# Patient Record
Sex: Female | Born: 1995 | Hispanic: Yes | Smoking: Never smoker
Health system: Southern US, Community
[De-identification: ages and names within clinical notes are randomized; demographics above are authoritative.]

## PROBLEM LIST (undated history)

## (undated) DIAGNOSIS — O039 Complete or unspecified spontaneous abortion without complication: Secondary | ICD-10-CM

## (undated) HISTORY — PX: FRACTURE SURGERY: SHX138

## (undated) HISTORY — PX: OTHER SURGICAL HISTORY: SHX169

---
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
## 2017-12-23 ENCOUNTER — Encounter (HOSPITAL_COMMUNITY): Payer: Self-pay

## 2017-12-23 ENCOUNTER — Other Ambulatory Visit: Payer: Self-pay

## 2017-12-23 ENCOUNTER — Ambulatory Visit (HOSPITAL_COMMUNITY)
Admission: EM | Admit: 2017-12-23 | Discharge: 2017-12-23 | Disposition: A | Discharge status: Home / Self Care | Payer: Self-pay | Attending: Family Medicine | Admitting: Family Medicine

## 2017-12-23 DIAGNOSIS — Z7722 Contact with and (suspected) exposure to environmental tobacco smoke (acute) (chronic): Secondary | ICD-10-CM | POA: Insufficient documentation

## 2017-12-23 DIAGNOSIS — B9789 Other viral agents as the cause of diseases classified elsewhere: Secondary | ICD-10-CM

## 2017-12-23 DIAGNOSIS — J029 Acute pharyngitis, unspecified: Secondary | ICD-10-CM | POA: Insufficient documentation

## 2017-12-23 DIAGNOSIS — R05 Cough: Secondary | ICD-10-CM

## 2017-12-23 DIAGNOSIS — J069 Acute upper respiratory infection, unspecified: Secondary | ICD-10-CM

## 2017-12-23 LAB — POCT RAPID STREP A: Streptococcus, Group A Screen (Direct): NEGATIVE

## 2017-12-23 MED ORDER — IBUPROFEN 600 MG PO TABS: 600.0000 mg | ORAL_TABLET | Freq: Four times a day (QID) | ORAL | 0 refills | Status: DC | PRN

## 2017-12-23 MED ORDER — PSEUDOEPH-BROMPHEN-DM 30-2-10 MG/5ML PO SYRP: 5.0000 mL | ORAL_SOLUTION | Freq: Four times a day (QID) | ORAL | 0 refills | Status: DC | PRN

## 2017-12-23 MED ORDER — CETIRIZINE HCL 10 MG PO CAPS: 10.0000 mg | ORAL_CAPSULE | Freq: Every day | ORAL | 0 refills | Status: DC

## 2017-12-23 MED ORDER — FLUTICASONE PROPIONATE 50 MCG/ACT NA SUSP: 1.0000 | Freq: Every day | NASAL | 0 refills | Status: DC

## 2017-12-23 NOTE — Discharge Instructions (Signed)
You likely having a viral upper respiratory infection. We recommended symptom control. I expect your symptoms to start improving in the next 1-2 weeks.   1. Take a daily allergy pill/anti-histamine like Zyrtec, Claritin, or Store brand consistently for 2 weeks  2. For congestion you may try an oral decongestant like Mucinex or sudafed. You may also try intranasal flonase nasal spray or saline irrigations (neti pot, sinus cleanse)  3. For your sore throat you may try cepacol lozenges, salt water gargles, throat spray. Treatment of congestion may also help your sore throat.  4. For cough you may try cough syrup provided or Robitussen, Mucinex Dm, Delsym  5. Take Tylenol or Ibuprofen to help with pain/inflammation  6. Stay hydrated, drink plenty of fluids to keep throat coated and less irritated  Honey Tea For cough/sore throat try using a honey-based tea. Use 3 teaspoons of honey with juice squeezed from half lemon. Place shaved pieces of ginger into 1/2-1 cup of water and warm over stove top. Then mix the ingredients and repeat every 4 hours as needed.

## 2017-12-23 NOTE — ED Provider Notes (Signed)
[facility]    CSN: [id number] Arrival date & time: 12/23/17  1331     History   Chief Complaint Chief Complaint  Patient presents with  . Facial Pain  . Nasal Congestion    HPI Amanda Vaughn is a 22 y.o. female no significant past medical history, Patient is presenting with URI symptoms- congestion, cough, sore throat.  Patient has also had body aches and headaches.  Patient's main complaints are sore throat and cough. Symptoms have been going on for 4 days. Patient has tried NyQuil, DayQuil, with minimal relief. Denies fever, nausea, vomiting, diarrhea. Denies shortness of breath and chest pain.    HPI  History reviewed. No pertinent past medical history.  There are no active problems to display for this patient.   Past Surgical History:  Procedure Laterality Date  . arm surgery    . FRACTURE SURGERY      OB History   None      Home Medications    Prior to Admission medications   Medication Sig Start Date End Date Taking? Authorizing Provider  brompheniramine-pseudoephedrine-DM 30-2-10 MG/5ML syrup Take 5 mLs by mouth 4 (four) times daily as needed. 12/23/17   Talene Glastetter C, PA-C  Cetirizine HCl 10 MG CAPS Take 1 capsule (10 mg total) by mouth daily for 10 days. 12/23/17 01/02/18  Neithan Day C, PA-C  fluticasone (FLONASE) 50 MCG/ACT nasal spray Place 1-2 sprays into both nostrils daily for 7 days. 12/23/17 12/30/17  Lilliona Blakeney C, PA-C  ibuprofen (ADVIL,MOTRIN) 600 MG tablet Take 1 tablet (600 mg total) by mouth every 6 (six) hours as needed. 12/23/17   Antanisha Mohs, Junius Creamer, PA-C    Family History Family History  Problem Relation Age of Onset  . Hyperlipidemia Other   . Hypertension Other   . Heart failure Other   . Diabetes Other   . Cancer Other     Social History Social History   Tobacco Use  . Smoking status: Passive Smoke Exposure - Never Smoker  . Smokeless tobacco: Never Used  Substance Use Topics  . Alcohol use: No  .  Drug use: No     Allergies   Patient has no known allergies.   Review of Systems Review of Systems  Constitutional: Negative for activity change, appetite change, chills, fatigue and fever.  HENT: Positive for congestion, rhinorrhea and sore throat. Negative for ear pain, sinus pressure and trouble swallowing.   Eyes: Negative for discharge and redness.  Respiratory: Positive for cough. Negative for chest tightness and shortness of breath.   Cardiovascular: Negative for chest pain.  Gastrointestinal: Negative for abdominal pain, diarrhea, nausea and vomiting.  Musculoskeletal: Positive for myalgias.  Skin: Negative for rash.  Neurological: Positive for headaches. Negative for dizziness and light-headedness.     Physical Exam Triage Vital Signs ED Triage Vitals  Enc Vitals Group     BP 12/23/17 1428 (!) 134/91     Pulse Rate 12/23/17 1428 89     Resp 12/23/17 1428 18     Temp 12/23/17 1427 99.1 F (37.3 C)     Temp Source 12/23/17 1427 Oral     SpO2 12/23/17 1428 100 %     Weight --      Height --      Head Circumference --      Peak Flow --      Pain Score 12/23/17 1428 3     Pain Loc --      Pain Edu? --  Excl. in GC? --    No data found.  Updated Vital Signs BP (!) 134/91 (BP Location: Right Arm)   Pulse 89   Temp 99.1 F (37.3 C) (Oral)   Resp 18   LMP 12/16/2017   SpO2 100%   Visual Acuity Right Eye Distance:   Left Eye Distance:   Bilateral Distance:    Right Eye Near:   Left Eye Near:    Bilateral Near:     Physical Exam  Constitutional: She appears well-developed and well-nourished. No distress.  HENT:  Head: Normocephalic and atraumatic.  Bilateral ears without tenderness to palpation of external auricle, tragus and mastoid, EAC's without erythema or swelling, TM's with good bony landmarks and cone of light. Non erythematous.  Oral mucosa pink and moist, no tonsillar enlargement or exudate. Posterior pharynx patent and erythematous, no  uvula deviation or swelling. Normal phonation.  Eyes: Conjunctivae are normal.  Neck: Neck supple.  Cardiovascular: Normal rate and regular rhythm.  No murmur heard. Pulmonary/Chest: Effort normal and breath sounds normal. No respiratory distress.  Breathing comfortably at rest, CTABL, no wheezing, rales or other adventitious sounds auscultated  Abdominal: Soft. There is no tenderness.  Musculoskeletal: She exhibits no edema.  Neurological: She is alert.  Skin: Skin is warm and dry.  Psychiatric: She has a normal mood and affect.  Nursing note and vitals reviewed.    UC Treatments / Results  Labs (all labs ordered are listed, but only abnormal results are displayed) Labs Reviewed  CULTURE, GROUP A STREP [facility])  POCT RAPID STREP A    EKG None  Radiology No results found.  Procedures Procedures (including critical care time)  Medications Ordered in UC Medications - No data to display  Initial Impression / Assessment and Plan / UC Course  I have reviewed the triage vital signs and the nursing notes.  Pertinent labs & imaging results that were available during my care of the patient were reviewed by me and considered in my medical decision making (see chart for details).      Strep test negative, URI symptoms x4 days, vital signs stable, exam unremarkable.  Recommend symptomatic management for likely viral etiology.  Recommendations below.  Continue to monitor symptoms, breathing and temperature,Discussed strict return precautions. Patient verbalized understanding and is agreeable with plan.  Final Clinical Impressions(s) / UC Diagnoses   Final diagnoses:  Viral URI with cough     Discharge Instructions     You likely having a viral upper respiratory infection. We recommended symptom control. I expect your symptoms to start improving in the next 1-2 weeks.   1. Take a daily allergy pill/anti-histamine like Zyrtec, Claritin, or Store brand consistently for 2  weeks  2. For congestion you may try an oral decongestant like Mucinex or sudafed. You may also try intranasal flonase nasal spray or saline irrigations (neti pot, sinus cleanse)  3. For your sore throat you may try cepacol lozenges, salt water gargles, throat spray. Treatment of congestion may also help your sore throat.  4. For cough you may try cough syrup provided or Robitussen, Mucinex Dm, Delsym  5. Take Tylenol or Ibuprofen to help with pain/inflammation  6. Stay hydrated, drink plenty of fluids to keep throat coated and less irritated  Honey Tea For cough/sore throat try using a honey-based tea. Use 3 teaspoons of honey with juice squeezed from half lemon. Place shaved pieces of ginger into 1/2-1 cup of water and warm over stove top. Then mix the ingredients  and repeat every 4 hours as needed.   ED Prescriptions    Medication Sig Dispense Auth. Provider   Cetirizine HCl 10 MG CAPS Take 1 capsule (10 mg total) by mouth daily for 10 days. 10 capsule Rheta Hemmelgarn C, PA-C   fluticasone (FLONASE) 50 MCG/ACT nasal spray Place 1-2 sprays into both nostrils daily for 7 days. 1 g Jayda White C, PA-C   brompheniramine-pseudoephedrine-DM 30-2-10 MG/5ML syrup Take 5 mLs by mouth 4 (four) times daily as needed. 120 mL Tarence Searcy C, PA-C   ibuprofen (ADVIL,MOTRIN) 600 MG tablet Take 1 tablet (600 mg total) by mouth every 6 (six) hours as needed. 30 tablet Shavon Zenz, CenterHallie C, PA-C     Controlled Substance Prescriptions [place] Controlled Substance Registry consulted? Not Applicable   Lew DawesWieters, Shanise Balch C, [place]PA-C 12/23/17 1916

## 2017-12-23 NOTE — ED Triage Notes (Signed)
Pt cc  Cough and congestion. x 3 days dry cough as well.

## 2017-12-26 LAB — CULTURE, GROUP A STREP (THRC)

## 2018-10-07 ENCOUNTER — Encounter (HOSPITAL_COMMUNITY): Payer: Self-pay | Admitting: Emergency Medicine

## 2018-10-07 ENCOUNTER — Other Ambulatory Visit: Payer: Self-pay

## 2018-10-07 ENCOUNTER — Emergency Department (HOSPITAL_COMMUNITY)
Admission: EM | Admit: 2018-10-07 | Discharge: 2018-10-08 | Disposition: A | Discharge status: Home / Self Care | Payer: Self-pay | Attending: Emergency Medicine | Admitting: Emergency Medicine

## 2018-10-07 DIAGNOSIS — Z3202 Encounter for pregnancy test, result negative: Secondary | ICD-10-CM | POA: Insufficient documentation

## 2018-10-07 DIAGNOSIS — Z7722 Contact with and (suspected) exposure to environmental tobacco smoke (acute) (chronic): Secondary | ICD-10-CM | POA: Insufficient documentation

## 2018-10-07 DIAGNOSIS — N938 Other specified abnormal uterine and vaginal bleeding: Secondary | ICD-10-CM | POA: Insufficient documentation

## 2018-10-07 DIAGNOSIS — R110 Nausea: Secondary | ICD-10-CM | POA: Insufficient documentation

## 2018-10-07 LAB — COMPREHENSIVE METABOLIC PANEL
ALT: 20 U/L (ref 0–44)
AST: 19 U/L (ref 15–41)
Albumin: 3.9 g/dL (ref 3.5–5.0)
Alkaline Phosphatase: 64 U/L (ref 38–126)
Anion gap: 8 (ref 5–15)
BUN: 8 mg/dL (ref 6–20)
CO2: 22 mmol/L (ref 22–32)
Calcium: 9.2 mg/dL (ref 8.9–10.3)
Chloride: 108 mmol/L (ref 98–111)
Creatinine, Ser: 0.7 mg/dL (ref 0.44–1.00)
GFR calc Af Amer: >60 mL/min (ref >60)
GFR calc non Af Amer: >60 mL/min (ref >60)
Glucose, Bld: 92 mg/dL (ref 70–99)
Potassium: 4.1 mmol/L (ref 3.5–5.1)
Sodium: 138 mmol/L (ref 135–145)
Total Bilirubin: 0.1 mg/dL — ABNORMAL LOW (ref 0.3–1.2)
Total Protein: 7.1 g/dL (ref 6.5–8.1)

## 2018-10-07 LAB — URINALYSIS, ROUTINE W REFLEX MICROSCOPIC
Bilirubin Urine: NEGATIVE
Glucose, UA: NEGATIVE mg/dL
Ketones, ur: NEGATIVE mg/dL
Leukocytes,Ua: NEGATIVE
Nitrite: NEGATIVE
Protein, ur: NEGATIVE mg/dL
Specific Gravity, Urine: 1.006 (ref 1.005–1.030)
pH: 7 (ref 5.0–8.0)

## 2018-10-07 LAB — CBC
HCT: 40.8 % (ref 36.0–46.0)
Hemoglobin: 13.3 g/dL (ref 12.0–15.0)
MCH: 26.1 pg (ref 26.0–34.0)
MCHC: 32.6 g/dL (ref 30.0–36.0)
MCV: 80.2 fL (ref 80.0–100.0)
Platelets: 462 10*3/uL — ABNORMAL HIGH (ref 150–400)
RBC: 5.09 MIL/uL (ref 3.87–5.11)
RDW: 15.5 % (ref 11.5–15.5)
WBC: 10 10*3/uL (ref 4.0–10.5)
nRBC: 0 % (ref 0.0–0.2)

## 2018-10-07 LAB — I-STAT BETA HCG BLOOD, ED (MC, WL, AP ONLY): I-stat hCG, quantitative: <5 m[IU]/mL (ref <5)

## 2018-10-07 MED ORDER — SODIUM CHLORIDE 0.9% FLUSH: 3.0000 mL | Freq: Once | INTRAVENOUS | Status: DC

## 2018-10-07 NOTE — ED Triage Notes (Signed)
Pt concerned she may be pregnant. C/o lower abdominal pain and nausea. Reports she has been having off and on spotting x 2 days that has been a "mocha" color. LMP 7/31.

## 2018-10-08 LAB — WET PREP, GENITAL
Sperm: NONE SEEN
Trich, Wet Prep: NONE SEEN
Yeast Wet Prep HPF POC: NONE SEEN

## 2018-10-08 MED ORDER — NAPROXEN 500 MG PO TABS: 500.0000 mg | ORAL_TABLET | Freq: Two times a day (BID) | ORAL | 0 refills | Status: DC | PRN

## 2018-10-08 MED ORDER — NAPROXEN 250 MG PO TABS
500.0000 mg | ORAL_TABLET | Freq: Once | ORAL | Status: AC
  Administered 2018-10-08: 07:00:00 500 mg via ORAL
  Filled 2018-10-08: qty 2

## 2018-10-08 NOTE — ED Notes (Signed)
Discharge instructions discussed with pt. Pt verbalized understanding. Pt stable and ambulatory. No signature pad available. 

## 2018-10-08 NOTE — Discharge Instructions (Addendum)
Your work-up in the emergency department today has been reassuring.  We recommend the use of naproxen for management of persistent pain.  Follow-up with an OB/GYN for repeat evaluation and ongoing GYN care.  You may return for any new or concerning symptoms.

## 2018-10-08 NOTE — ED Provider Notes (Signed)
[name] Provider Note   CSN: [id number] Arrival date & time: 10/07/18  2004     History   Chief Complaint Chief Complaint  Patient presents with  . Possible Pregnancy  . Abdominal Pain    HPI Amanda Vaughn is a 23 y.o. female.     23 y/o female presents to the emergency department for 11 days of lower abdominal discomfort.  She states that pain is been constant and unchanged.  The pain does not radiate.  It is worse with certain movements as well as when bending over.  Reports some associated nausea without vomiting.  No bowel changes.  It became associated with vaginal spotting 48 hours ago.  She has required the use of 2 pads in 48 hours.  Patient denies vaginal discharge, fever, dysuria, hematuria.  She is sexually active with one female partner and denies the use of condoms.  Last menstrual period 08/27/2018.  Expresses concern for pregnancy.  The history is provided by the patient. No language interpreter was used.  Possible Pregnancy Associated symptoms include abdominal pain.  Abdominal Pain   History reviewed. No pertinent past medical history.  There are no active problems to display for this patient.   Past Surgical History:  Procedure Laterality Date  . arm surgery    . FRACTURE SURGERY       OB History   No obstetric history on file.      Home Medications    Prior to Admission medications   Medication Sig Start Date End Date Taking? Authorizing Provider  brompheniramine-pseudoephedrine-DM 30-2-10 MG/5ML syrup Take 5 mLs by mouth 4 (four) times daily as needed. 12/23/17   Wieters, Hallie C, PA-C  Cetirizine HCl 10 MG CAPS Take 1 capsule (10 mg total) by mouth daily for 10 days. 12/23/17 01/02/18  Wieters, Hallie C, PA-C  fluticasone (FLONASE) 50 MCG/ACT nasal spray Place 1-2 sprays into both nostrils daily for 7 days. 12/23/17 12/30/17  Wieters, Hallie C, PA-C  ibuprofen (ADVIL,MOTRIN) 600 MG tablet Take 1 tablet  (600 mg total) by mouth every 6 (six) hours as needed. 12/23/17   Wieters, Hallie C, PA-C  naproxen (NAPROSYN) 500 MG tablet Take 1 tablet (500 mg total) by mouth every 12 (twelve) hours as needed for mild pain or moderate pain. 10/08/18   Antonietta Breach, PA-C    Family History Family History  Problem Relation Age of Onset  . Hyperlipidemia Other   . Hypertension Other   . Heart failure Other   . Diabetes Other   . Cancer Other     Social History Social History   Tobacco Use  . Smoking status: Passive Smoke Exposure - Never Smoker  . Smokeless tobacco: Never Used  Substance Use Topics  . Alcohol use: No  . Drug use: No     Allergies   Patient has no known allergies.   Review of Systems Review of Systems  Gastrointestinal: Positive for abdominal pain.  Ten systems reviewed and are negative for acute change, except as noted in the HPI.    Physical Exam Updated Vital Signs BP 124/77   Pulse 72   Temp 98.2 F (36.8 C) (Oral)   Resp 16   LMP 08/27/2018   SpO2 100%   Physical Exam Vitals signs and nursing note reviewed.  Constitutional:      General: She is not in acute distress.    Appearance: She is well-developed. She is not diaphoretic.     Comments: Nontoxic appearing  and in NAD  HENT:     Head: Normocephalic and atraumatic.  Eyes:     General: No scleral icterus.    Conjunctiva/sclera: Conjunctivae normal.  Neck:     Musculoskeletal: Normal range of motion.  Pulmonary:     Effort: Pulmonary effort is normal. No respiratory distress.     Comments: Respirations even and unlabored Abdominal:     Palpations: There is no mass.     Tenderness: There is no guarding.     Comments: Reports pain when palpated in the lower abdomen, but minimal tenderness.  Exam is benign.  Abdomen is soft, nondistended.  No peritoneal signs.  Genitourinary:    Comments: Normal external genitalia. Pale pink discharge. Speculum exam deferred as patient has never had a pelvic exam  performed. Musculoskeletal: Normal range of motion.  Skin:    General: Skin is warm and dry.     Coloration: Skin is not pale.     Findings: No erythema or rash.  Neurological:     General: No focal deficit present.     Mental Status: She is alert and oriented to person, place, and time.     Coordination: Coordination normal.     Comments: Ambulates with steady gait.  Psychiatric:        Behavior: Behavior normal.      ED Treatments / Results  Labs (all labs ordered are listed, but only abnormal results are displayed) Labs Reviewed  COMPREHENSIVE METABOLIC PANEL - Abnormal; Notable for the following components:      Result Value   Total Bilirubin 0.1 (*)    All other components within normal limits  CBC - Abnormal; Notable for the following components:   Platelets 462 (*)    All other components within normal limits  URINALYSIS, ROUTINE W REFLEX MICROSCOPIC - Abnormal; Notable for the following components:   Color, Urine STRAW (*)    Hgb urine dipstick MODERATE (*)    Bacteria, UA RARE (*)    All other components within normal limits  WET PREP, GENITAL  I-STAT BETA HCG BLOOD, ED ([facility], [facility], [facility] ONLY)  GC/CHLAMYDIA PROBE AMP ([place]) NOT AT [facility]    EKG None  Radiology No results found.  Procedures Procedures (including critical care time)  Medications Ordered in ED Medications  sodium chloride flush (NS) 0.9 % injection 3 mL (3 mLs Intravenous Not Given 10/08/18 0634)  naproxen (NAPROSYN) tablet 500 mg (500 mg Oral Given 10/08/18 1610)     Initial Impression / Assessment and Plan / ED Course  I have reviewed the triage vital signs and the nursing notes.  Pertinent labs & imaging results that were available during my care of the patient were reviewed by me and considered in my medical decision making (see chart for details).        23 year old female presents to the emergency department for lower abdominal cramping.  This is suspected secondary to  dysfunctional uterine bleeding.  Pain present x11 days.  She has no focal tenderness on exam.  Doubt ovarian torsion given symptom chronicity.  Also low suspicion for ovarian abscess.  The patient is nontoxic, afebrile.  She has no leukocytosis.  Laboratory evaluation reassuring.    Wet prep is not presently concerning for STIs. GC/Chlamydia pending. Plan for supportive management with naproxen.  She has been given referral to women's outpatient clinic for follow-up PRN.  Return precautions discussed and provided. Patient discharged in stable condition with no unaddressed concerns.   Final Clinical Impressions(s) /  ED Diagnoses   Final diagnoses:  Dysfunctional uterine bleeding    ED Discharge Orders         Ordered    naproxen (NAPROSYN) 500 MG tablet  Every 12 hours PRN     10/08/18 0631           Antony MaduraHumes, Ladina Shutters, PA-C 10/08/18 [id number]    Horton, Mayer Maskerourtney F, MD 10/11/18 [id number]    Shon BatonHorton, Courtney F, MD 10/11/18 [phone]

## 2018-10-09 LAB — GC/CHLAMYDIA PROBE AMP (~~LOC~~) NOT AT ARMC
Chlamydia: POSITIVE — AB
Neisseria Gonorrhea: NEGATIVE

## 2019-06-01 ENCOUNTER — Encounter (HOSPITAL_COMMUNITY): Payer: Self-pay | Admitting: Emergency Medicine

## 2019-06-01 ENCOUNTER — Emergency Department (HOSPITAL_COMMUNITY)

## 2019-06-01 ENCOUNTER — Emergency Department (HOSPITAL_COMMUNITY)
Admission: EM | Admit: 2019-06-01 | Discharge: 2019-06-01 | Disposition: A | Discharge status: Home / Self Care | Attending: Emergency Medicine | Admitting: Emergency Medicine

## 2019-06-01 ENCOUNTER — Other Ambulatory Visit: Payer: Self-pay

## 2019-06-01 DIAGNOSIS — Z7722 Contact with and (suspected) exposure to environmental tobacco smoke (acute) (chronic): Secondary | ICD-10-CM | POA: Insufficient documentation

## 2019-06-01 DIAGNOSIS — M542 Cervicalgia: Secondary | ICD-10-CM | POA: Diagnosis not present

## 2019-06-01 DIAGNOSIS — Y92511 Restaurant or cafe as the place of occurrence of the external cause: Secondary | ICD-10-CM | POA: Insufficient documentation

## 2019-06-01 DIAGNOSIS — M25511 Pain in right shoulder: Secondary | ICD-10-CM | POA: Insufficient documentation

## 2019-06-01 DIAGNOSIS — R0602 Shortness of breath: Secondary | ICD-10-CM | POA: Insufficient documentation

## 2019-06-01 DIAGNOSIS — R0789 Other chest pain: Secondary | ICD-10-CM | POA: Diagnosis not present

## 2019-06-01 DIAGNOSIS — R1310 Dysphagia, unspecified: Secondary | ICD-10-CM | POA: Insufficient documentation

## 2019-06-01 DIAGNOSIS — Y9389 Activity, other specified: Secondary | ICD-10-CM | POA: Diagnosis not present

## 2019-06-01 DIAGNOSIS — R519 Headache, unspecified: Secondary | ICD-10-CM | POA: Diagnosis present

## 2019-06-01 DIAGNOSIS — Y998 Other external cause status: Secondary | ICD-10-CM | POA: Insufficient documentation

## 2019-06-01 HISTORY — DX: Complete or unspecified spontaneous abortion without complication: O03.9

## 2019-06-01 LAB — I-STAT BETA HCG BLOOD, ED (MC, WL, AP ONLY): I-stat hCG, quantitative: <5 m[IU]/mL (ref <5)

## 2019-06-01 LAB — I-STAT CREATININE, ED: Creatinine, Ser: 0.5 mg/dL (ref 0.44–1.00)

## 2019-06-01 LAB — POC URINE PREG, ED: Preg Test, Ur: NEGATIVE

## 2019-06-01 MED ORDER — CYCLOBENZAPRINE HCL 10 MG PO TABS
10.0000 mg | ORAL_TABLET | Freq: Two times a day (BID) | ORAL | 0 refills | Status: DC | PRN
Start: 2019-06-01 — End: 2020-12-29

## 2019-06-01 MED ORDER — LIDOCAINE 4 % EX CREA
1.0000 | TOPICAL_CREAM | Freq: Three times a day (TID) | CUTANEOUS | 0 refills | Status: DC | PRN
Start: 2019-06-01 — End: 2020-12-29

## 2019-06-01 MED ORDER — KETOROLAC TROMETHAMINE 30 MG/ML IJ SOLN
30.0000 mg | Freq: Once | INTRAMUSCULAR | Status: AC
  Administered 2019-06-01: 30 mg via INTRAVENOUS
  Filled 2019-06-01: qty 1

## 2019-06-01 MED ORDER — KETOROLAC TROMETHAMINE 60 MG/2ML IM SOLN: 60.0000 mg | Freq: Once | INTRAMUSCULAR | Status: DC

## 2019-06-01 MED ORDER — IBUPROFEN 600 MG PO TABS
600.0000 mg | ORAL_TABLET | Freq: Four times a day (QID) | ORAL | 0 refills | Status: DC | PRN
Start: 2019-06-01 — End: 2020-12-29

## 2019-06-01 MED ORDER — ACETAMINOPHEN 500 MG PO TABS
500.0000 mg | ORAL_TABLET | Freq: Four times a day (QID) | ORAL | 0 refills | Status: DC | PRN
Start: 2019-06-01 — End: 2020-12-29

## 2019-06-01 MED ORDER — IOHEXOL 300 MG/ML  SOLN
75.0000 mL | Freq: Once | INTRAMUSCULAR | Status: AC | PRN
  Administered 2019-06-01: 75 mL via INTRAVENOUS

## 2019-06-01 NOTE — ED Notes (Signed)
Patient verbalizes understanding of discharge instructions. Opportunity for questioning and answers were provided. Armband removed by staff, pt discharged from ED ambulatory to home.  
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
## 2019-06-01 NOTE — Discharge Instructions (Signed)
Alternate 600 mg of ibuprofen and [phone] mg of Tylenol every 3 hours as needed for pain. Do not exceed 4000 mg of Tylenol daily.  Take ibuprofen with food to avoid upset stomach issues.  Apply ice or heat, whichever feels best for comfort.  I usually recommend 20 minutes at a time 2-3 times daily.  Take some hot showers and hot baths throughout the day and do some gentle stretching to avoid muscle stiffness.  You can also apply lidocaine cream to areas of soreness.  You can take Flexeril which is a muscle relaxant.  This medication can cause drowsiness.  Do not drive, drink alcohol or operate heavy machinery while taking this medication due to side effects.  I typically recommend only taking at night to help you get to sleep and you can also cut these tablets in half if they are very sedating.  You may have a concussion, which is head injury without evidence of abnormality on CT scan.  Stay in a dimly lit room with minimal stimuli, reduce screen time (phone, TV) by at least 50% if not more, and avoid contact sports until cleared by a physician.    I anticipate you will probably feel sore for approximately 1 week.  Do not feel surprised if you wake up tomorrow feeling more sore than you do today. Follow-up with a primary care physician if you are not back to your normal activity levels within 1 week.  Return to the emergency department immediately for any concerning signs or symptoms develop such as altered mental status, persistent vomiting, weakness, severe swelling or redness of a limb, or fevers.

## 2020-12-29 ENCOUNTER — Other Ambulatory Visit: Payer: Self-pay

## 2020-12-29 ENCOUNTER — Ambulatory Visit (HOSPITAL_COMMUNITY)
Admission: EM | Admit: 2020-12-29 | Discharge: 2020-12-29 | Disposition: A | Discharge status: Home / Self Care | Payer: Medicaid Other | Attending: Physician Assistant | Admitting: Physician Assistant

## 2020-12-29 ENCOUNTER — Encounter (HOSPITAL_COMMUNITY): Payer: Self-pay

## 2020-12-29 ENCOUNTER — Ambulatory Visit (HOSPITAL_COMMUNITY): Admit: 2020-12-29 | Discharge status: Absent without leave | Payer: Self-pay

## 2020-12-29 DIAGNOSIS — R110 Nausea: Secondary | ICD-10-CM | POA: Insufficient documentation

## 2020-12-29 DIAGNOSIS — R1084 Generalized abdominal pain: Secondary | ICD-10-CM | POA: Insufficient documentation

## 2020-12-29 LAB — POC URINE PREG, ED: Preg Test, Ur: NEGATIVE

## 2020-12-29 LAB — COMPREHENSIVE METABOLIC PANEL
ALT: 42 U/L (ref 0–44)
AST: 27 U/L (ref 15–41)
Albumin: 3.6 g/dL (ref 3.5–5.0)
Alkaline Phosphatase: 72 U/L (ref 38–126)
Anion gap: 4 — ABNORMAL LOW (ref 5–15)
BUN: 8 mg/dL (ref 6–20)
CO2: 24 mmol/L (ref 22–32)
Calcium: 8.7 mg/dL — ABNORMAL LOW (ref 8.9–10.3)
Chloride: 111 mmol/L (ref 98–111)
Creatinine, Ser: 0.72 mg/dL (ref 0.44–1.00)
GFR, Estimated: >60 mL/min (ref >60)
Glucose, Bld: 95 mg/dL (ref 70–99)
Potassium: 4.1 mmol/L (ref 3.5–5.1)
Sodium: 139 mmol/L (ref 135–145)
Total Bilirubin: 0.2 mg/dL — ABNORMAL LOW (ref 0.3–1.2)
Total Protein: 6.8 g/dL (ref 6.5–8.1)

## 2020-12-29 LAB — CBC WITH DIFFERENTIAL/PLATELET
Abs Immature Granulocytes: 0.03 10*3/uL (ref 0.00–0.07)
Basophils Absolute: 0 10*3/uL (ref 0.0–0.1)
Basophils Relative: 0 %
Eosinophils Absolute: 0 10*3/uL (ref 0.0–0.5)
Eosinophils Relative: 0 %
HCT: 43.2 % (ref 36.0–46.0)
Hemoglobin: 14.1 g/dL (ref 12.0–15.0)
Immature Granulocytes: 0 %
Lymphocytes Relative: 37 %
Lymphs Abs: 2.6 10*3/uL (ref 0.7–4.0)
MCH: 26.9 pg (ref 26.0–34.0)
MCHC: 32.6 g/dL (ref 30.0–36.0)
MCV: 82.4 fL (ref 80.0–100.0)
Monocytes Absolute: 0.4 10*3/uL (ref 0.1–1.0)
Monocytes Relative: 6 %
Neutro Abs: 3.9 10*3/uL (ref 1.7–7.7)
Neutrophils Relative %: 57 %
Platelets: 540 10*3/uL — ABNORMAL HIGH (ref 150–400)
RBC: 5.24 MIL/uL — ABNORMAL HIGH (ref 3.87–5.11)
RDW: 14.6 % (ref 11.5–15.5)
WBC: 7 10*3/uL (ref 4.0–10.5)
nRBC: 0 % (ref 0.0–0.2)

## 2020-12-29 LAB — POCT URINALYSIS DIPSTICK, ED / UC
Bilirubin Urine: NEGATIVE
Glucose, UA: NEGATIVE mg/dL
Hgb urine dipstick: NEGATIVE
Leukocytes,Ua: NEGATIVE
Nitrite: NEGATIVE
Protein, ur: NEGATIVE mg/dL
Specific Gravity, Urine: >=1.03 (ref 1.005–1.030)
Urobilinogen, UA: 0.2 mg/dL (ref 0.0–1.0)
pH: 6 (ref 5.0–8.0)

## 2020-12-29 MED ORDER — PANTOPRAZOLE SODIUM 40 MG PO TBEC: 40.0000 mg | DELAYED_RELEASE_TABLET | Freq: Every day | ORAL | 0 refills | Status: AC

## 2020-12-29 MED ORDER — ONDANSETRON 4 MG PO TBDP: 4.0000 mg | ORAL_TABLET | Freq: Three times a day (TID) | ORAL | 0 refills | Status: AC | PRN

## 2020-12-29 NOTE — ED Provider Notes (Signed)
[facility]    CSN: [id number] Arrival date & time: 12/29/20  1251      History   Chief Complaint Chief Complaint  Patient presents with   Abdominal Pain    HPI Amanda Vaughn is a 25 y.o. female.   Patient presents today with a several week history of migrating abdominal pain.  She reports several weeks ago she woke up in a cold sweat which lasted for 24 hours.  Since that time she has had ongoing migrating abdominal pain.  Reports this is primarily in her upper abdomen but will occasionally travel to her lower abdomen.  She is currently asymptomatic symptoms and to be triggered by eating.  She has not identified any specific foods that exacerbate symptoms.  She reports occasional lightheadedness and feeling poorly.  She does not take NSAIDs on a regular basis or drink alcohol regularly.  She denies any recent medication changes.  Denies any suspicious food intake, recent travel.  Denies any blood in her stool, vomiting, syncope, chest pain, shortness of breath.  She denies history of gastrointestinal disorder including ulcer disease.  She has not had abdominal surgeries and still has her gallbladder and appendix.  She has not tried any medication for symptom management.   Past Medical History:  Diagnosis Date   Miscarriage     There are no problems to display for this patient.   Past Surgical History:  Procedure Laterality Date   arm surgery     FRACTURE SURGERY      OB History   No obstetric history on file.      Home Medications    Prior to Admission medications   Medication Sig Start Date End Date Taking? Authorizing Provider  ondansetron (ZOFRAN-ODT) 4 MG disintegrating tablet Take 1 tablet (4 mg total) by mouth every 8 (eight) hours as needed for nausea or vomiting. 12/29/20  Yes Daymond Cordts K, PA-C  pantoprazole (PROTONIX) 40 MG tablet Take 1 tablet (40 mg total) by mouth daily. 12/29/20  Yes Fae Blossom, Noberto Retort, PA-C    Family History Family History   Problem Relation Age of Onset   Hyperlipidemia Other    Hypertension Other    Heart failure Other    Diabetes Other    Cancer Other     Social History Social History   Tobacco Use   Smoking status: Passive Smoke Exposure - Never Smoker   Smokeless tobacco: Never  Substance Use Topics   Alcohol use: No   Drug use: No     Allergies   Patient has no known allergies.   Review of Systems Review of Systems  Constitutional:  Positive for activity change, appetite change and fatigue. Negative for fever.  Respiratory:  Negative for cough and shortness of breath.   Cardiovascular:  Negative for chest pain.  Gastrointestinal:  Positive for abdominal pain and nausea. Negative for blood in stool, constipation, diarrhea and vomiting.  Musculoskeletal:  Negative for arthralgias and myalgias.  Neurological:  Negative for dizziness, light-headedness and headaches.    Physical Exam Triage Vital Signs ED Triage Vitals  Enc Vitals Group     BP 12/29/20 1412 (!) 137/92     Pulse Rate 12/29/20 1412 86     Resp 12/29/20 1412 18     Temp 12/29/20 1412 98.2 F (36.8 C)     Temp Source 12/29/20 1412 Oral     SpO2 12/29/20 1412 97 %     Weight --      Height --  Head Circumference --      Peak Flow --      Pain Score 12/29/20 1410 6     Pain Loc --      Pain Edu? --      Excl. in GC? --    No data found.  Updated Vital Signs BP (!) 137/92 (BP Location: Right Arm)   Pulse 86   Temp 98.2 F (36.8 C) (Oral)   Resp 18   SpO2 97%   Visual Acuity Right Eye Distance:   Left Eye Distance:   Bilateral Distance:    Right Eye Near:   Left Eye Near:    Bilateral Near:     Physical Exam Vitals reviewed.  Constitutional:      General: She is awake. She is not in acute distress.    Appearance: Normal appearance. She is well-developed. She is not ill-appearing.     Comments: Very pleasant female appears stated age sitting comfortably on exam room table in no acute distress   HENT:     Head: Normocephalic and atraumatic.     Mouth/Throat:     Pharynx: Uvula midline. No oropharyngeal exudate or posterior oropharyngeal erythema.  Cardiovascular:     Rate and Rhythm: Normal rate and regular rhythm.     Heart sounds: Normal heart sounds, S1 normal and S2 normal. No murmur heard. Pulmonary:     Effort: Pulmonary effort is normal.     Breath sounds: Normal breath sounds. No wheezing, rhonchi or rales.     Comments: Clear to auscultation bilaterally Abdominal:     General: Bowel sounds are normal.     Palpations: Abdomen is soft.     Tenderness: There is no abdominal tenderness. There is no right CVA tenderness, left CVA tenderness, guarding or rebound.     Comments: Benign abdominal exam.  No tenderness palpation.  Psychiatric:        Behavior: Behavior is cooperative.     UC Treatments / Results  Labs (all labs ordered are listed, but only abnormal results are displayed) Labs Reviewed  POCT URINALYSIS DIPSTICK, ED / UC - Abnormal; Notable for the following components:      Result Value   Ketones, ur TRACE (*)    All other components within normal limits  COMPREHENSIVE METABOLIC PANEL  CBC WITH DIFFERENTIAL/PLATELET  POC URINE PREG, ED    EKG   Radiology No results found.  Procedures Procedures (including critical care time)  Medications Ordered in UC Medications - No data to display  Initial Impression / Assessment and Plan / UC Course  I have reviewed the triage vital signs and the nursing notes.  Pertinent labs & imaging results that were available during my care of the patient were reviewed by me and considered in my medical decision making (see chart for details).     Vital signs and physical exam reassuring today; no indication for emergent evaluation or imaging.  Urine pregnancy was negative.  UA showed trace ketones otherwise was normal.  Patient reports that she is able to eat and drink but this does exacerbate symptoms.  She was  prescribed Zofran for nausea symptoms.  Recommend she start Protonix on an empty stomach.  Discussed that ultimately she needs to be evaluated by a GI specialist for further evaluation to rule out cholelithiasis and or peptic ulcer disease since we do not have the capabilities in urgent care.  She was given contact information for local GI specialist and encouraged to call to schedule  appointment with them first thing next week.  CBC and CMP were obtained today and if she has significant anemia or abnormal liver function testing she will need to go to the emergency room.  Recommend she avoid alcohol and NSAIDs.  Recommend she eat a bland diet and drink plenty of fluid.  Discussed alarm symptoms that warrant emergent evaluation including focal pain, nausea/vomiting interfering with oral intake, dark or bloody stools, syncopal episode, chest pain, shortness of breath.  Strict return precautions given to which she expressed understanding.  Final Clinical Impressions(s) / UC Diagnoses   Final diagnoses:  Generalized abdominal pain  Nausea     Discharge Instructions      I have called in Zofran to help with your nausea symptoms.  Please start Protonix in the morning 30 minutes before you eat or 2 hours after a meal to help with stomach pain and symptoms.  Make sure you eat a bland diet and avoid spicy/acidic/fatty foods.  Make sure you are drinking plenty of fluid.  I recommend you follow-up with the stomach specialist please call to schedule appointment first thing next week.  If you have any worsening symptoms including severe focal pain, nausea/vomiting interfering with oral intake, blood in her stool need to be seen immediately.     ED Prescriptions     Medication Sig Dispense Auth. Provider   ondansetron (ZOFRAN-ODT) 4 MG disintegrating tablet Take 1 tablet (4 mg total) by mouth every 8 (eight) hours as needed for nausea or vomiting. 20 tablet Nikolos Billig K, PA-C   pantoprazole (PROTONIX) 40  MG tablet Take 1 tablet (40 mg total) by mouth daily. 14 tablet Ndeye Tenorio, Noberto Retort, PA-C      PDMP not reviewed this encounter.   Jeani Hawking, PA-C 12/29/20 1522

## 2020-12-29 NOTE — Discharge Instructions (Signed)
I have called in Zofran to help with your nausea symptoms.  Please start Protonix in the morning 30 minutes before you eat or 2 hours after a meal to help with stomach pain and symptoms.  Make sure you eat a bland diet and avoid spicy/acidic/fatty foods.  Make sure you are drinking plenty of fluid.  I recommend you follow-up with the stomach specialist please call to schedule appointment first thing next week.  If you have any worsening symptoms including severe focal pain, nausea/vomiting interfering with oral intake, blood in her stool need to be seen immediately.

## 2020-12-29 NOTE — ED Triage Notes (Signed)
Pt reports nauseas after eating, on and off lightheadedness and lower abdominal pain x 12 days.
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
## 2021-08-12 ENCOUNTER — Encounter (HOSPITAL_COMMUNITY): Payer: Self-pay

## 2021-08-12 ENCOUNTER — Ambulatory Visit (HOSPITAL_COMMUNITY)
Admission: EM | Admit: 2021-08-12 | Discharge: 2021-08-12 | Disposition: A | Discharge status: Home / Self Care | Payer: Medicaid Other | Attending: Internal Medicine | Admitting: Internal Medicine

## 2021-08-12 DIAGNOSIS — N926 Irregular menstruation, unspecified: Secondary | ICD-10-CM

## 2021-08-12 LAB — POCT URINALYSIS DIPSTICK, ED / UC
Bilirubin Urine: NEGATIVE
Glucose, UA: NEGATIVE mg/dL
Hgb urine dipstick: NEGATIVE
Ketones, ur: NEGATIVE mg/dL
Leukocytes,Ua: NEGATIVE
Nitrite: NEGATIVE
Protein, ur: NEGATIVE mg/dL
Specific Gravity, Urine: >=1.03 (ref 1.005–1.030)
Urobilinogen, UA: 0.2 mg/dL (ref 0.0–1.0)
pH: 5.5 (ref 5.0–8.0)

## 2021-08-12 LAB — POC URINE PREG, ED: Preg Test, Ur: NEGATIVE

## 2021-08-12 NOTE — Discharge Instructions (Addendum)
[facility] for evaluation of your abnormal menstrual periods.

## 2021-08-12 NOTE — ED Provider Notes (Signed)
[facility]    CSN: [id number] Arrival date & time: 08/12/21  1535      History   Chief Complaint Chief Complaint  Patient presents with   Abdominal Pain   Menstrual Problem    HPI Amanda Vaughn is a 26 y.o. female.   HPI Patient with a history of recurrent abdominal pain presents today with with a concern of abdominal pain when she is exercising and sitting in a particular position.  The pain is not present at any other time.  This has been ongoing for few months.  Previously she was seen for recurrent heartburn and prescribed Protonix which she reports she stopped taking after the symptoms stopped.  She is not having those types of symptoms.  Endorses some dizziness and nausea when exercising.   She is also concerned about irregular periods and increased appetite.  She endorses having unprotected sex and no birth control along with an increased urge to urinate and flank pain.  She has had several irregular episodes of blood.  With her last menstrual period ending on 07/30/21.   Past Medical History:  Diagnosis Date   Miscarriage     There are no problems to display for this patient.   Past Surgical History:  Procedure Laterality Date   arm surgery     FRACTURE SURGERY      OB History   No obstetric history on file.      Home Medications    Prior to Admission medications   Medication Sig Start Date End Date Taking? Authorizing Provider  ondansetron (ZOFRAN-ODT) 4 MG disintegrating tablet Take 1 tablet (4 mg total) by mouth every 8 (eight) hours as needed for nausea or vomiting. 12/29/20   Raspet, Noberto Retort, PA-C  pantoprazole (PROTONIX) 40 MG tablet Take 1 tablet (40 mg total) by mouth daily. 12/29/20   Raspet, Noberto Retort, PA-C    Family History Family History  Problem Relation Age of Onset   Hyperlipidemia Other    Hypertension Other    Heart failure Other    Diabetes Other    Cancer Other     Social History Social History   Tobacco Use   Smoking  status: Never    Passive exposure: Yes   Smokeless tobacco: Never  Substance Use Topics   Alcohol use: No   Drug use: No     Allergies   Patient has no known allergies.   Review of Systems Review of Systems Pertinent negatives listed in HPI   Physical Exam Triage Vital Signs ED Triage Vitals  Enc Vitals Group     BP 08/12/21 1725 131/85     Pulse Rate 08/12/21 1725 75     Resp 08/12/21 1725 16     Temp 08/12/21 1725 98.5 F (36.9 C)     Temp Source 08/12/21 1725 Oral     SpO2 08/12/21 1725 98 %     Weight 08/12/21 1731 180 lb (81.6 kg)     Height 08/12/21 1731 4\' 10"  (1.473 m)     Head Circumference --      Peak Flow --      Pain Score 08/12/21 1730 3     Pain Loc --      Pain Edu? --      Excl. in GC? --    No data found.  Updated Vital Signs BP 131/85 (BP Location: Left Arm)   Pulse 75   Temp 98.5 F (36.9 C) (Oral)   Resp 16  Ht 4\' 10"  (1.473 m)   Wt 180 lb (81.6 kg)   LMP 07/30/2021 (Exact Date)   SpO2 98%   BMI 37.62 kg/m   Visual Acuity Right Eye Distance:   Left Eye Distance:   Bilateral Distance:    Right Eye Near:   Left Eye Near:    Bilateral Near:     Physical Exam Vitals reviewed.  Constitutional:      Appearance: She is well-developed.  HENT:     Head: Normocephalic and atraumatic.  Eyes:     Extraocular Movements: Extraocular movements intact.     Pupils: Pupils are equal, round, and reactive to light.  Cardiovascular:     Rate and Rhythm: Normal rate and regular rhythm.  Pulmonary:     Effort: Pulmonary effort is normal.     Breath sounds: Normal breath sounds.  Abdominal:     General: Bowel sounds are normal.     Palpations: Abdomen is soft.     Tenderness: There is no abdominal tenderness.  Skin:    General: Skin is warm and dry.  Neurological:     General: No focal deficit present.     Mental Status: She is alert.  Psychiatric:        Mood and Affect: Mood normal.        Behavior: Behavior normal.      UC  Treatments / Results  Labs (all labs ordered are listed, but only abnormal results are displayed) Labs Reviewed  POCT URINALYSIS DIPSTICK, ED / UC  POC URINE PREG, ED    EKG   Radiology No results found.  Procedures Procedures (including critical care time)  Medications Ordered in UC Medications - No data to display  Initial Impression / Assessment and Plan / UC Course  I have reviewed the triage vital signs and the nursing notes.  Pertinent labs & imaging results that were available during my care of the patient were reviewed by me and considered in my medical decision making (see chart for details).    Irregular menstrual cycles recommend follow-up at [facility] to schedule a well-woman's visit to be evaluated for etiology of irregular periods. UA unremarkable. Abdominal pain is benign patient was having some discomfort when exercising and when sitting in a specific position, no concern for acute abdomen.  Advised this is a normal body response when stretching abdominal muscles to help keep pain following exercise.  Recommend stretching prior to exercise to decrease the intensity of abdominal muscle pain to exercise. Final Clinical Impressions(s) / UC Diagnoses   Final diagnoses:  Irregular periods/menstrual cycles     Discharge Instructions      [facility] schedule an appointment for evaluation of your abnormal menstrual periods.       ED Prescriptions   None    PDMP not reviewed this encounter.   [facility], FNP 08/13/21 1022

## 2021-08-12 NOTE — ED Triage Notes (Signed)
First started with low abdominal cramping. Abdomen tender to the touch an having heart burn.  Reduction in work out intensity due to the pain. Would get nauseous and dizzy when working out.  Patient period irregular and having increased appetite. Patient also had 2 periods back to back(5/29 - 6/1, 6/6-6/9). LMP was very heavy, abnormal for the Patient, 7/4. Patient states she has been moody/hormonal. Patient having unprotected sex, no birthcontrol.  Mild flank pain. Sometimes has urinary urge even after peeing.

## 2021-08-22 ENCOUNTER — Other Ambulatory Visit: Payer: Self-pay

## 2021-08-22 ENCOUNTER — Emergency Department (HOSPITAL_COMMUNITY)
Admission: EM | Admit: 2021-08-22 | Discharge: 2021-08-23 | Disposition: A | Discharge status: Home / Self Care | Payer: Medicaid Other | Attending: Emergency Medicine | Admitting: Emergency Medicine

## 2021-08-22 DIAGNOSIS — R5383 Other fatigue: Secondary | ICD-10-CM

## 2021-08-22 DIAGNOSIS — R21 Rash and other nonspecific skin eruption: Secondary | ICD-10-CM

## 2021-08-22 DIAGNOSIS — R110 Nausea: Secondary | ICD-10-CM | POA: Insufficient documentation

## 2021-08-22 NOTE — ED Triage Notes (Signed)
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 Benadryl for symptoms, no change, advises tactile fevers. Denies changes in triggers.

## 2021-08-22 NOTE — ED Provider Triage Note (Signed)
Emergency Medicine Provider Triage Evaluation Note  Amanda Vaughn , a 26 y.o. female  was evaluated in triage.  Pt complains of rash for the past 5 days.  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  Has been taking Benadryl along with some topical ointment without any improvement in her symptoms.  Does have subjective fevers.  No changes in soap, no changes in detergent, no systemic signs..  Review of Systems  Positive: Rash Negative: Fever, abdominal pain, chest pain  Physical Exam  BP (!) 132/92   Pulse 79   Temp 98.9 F (37.2 C) (Oral)   Resp 14   LMP 07/30/2021 (Exact Date)   SpO2 100%  Gen:   Awake, no distress   Resp:  Normal effort  MSK:   Moves extremities without difficulty  Other:  Rash throughout her back and trunk and torso.  Medical Decision Making  Medically screening exam initiated at 4:54 PM.  Appropriate orders placed.  Jhada Risk was informed that the remainder of the evaluation will be completed by another provider, this initial triage assessment does not replace that evaluation, and the importance of remaining in the ED until their evaluation is complete.     Claude Manges, PA-C 08/22/21 1658

## 2021-08-23 LAB — COMPREHENSIVE METABOLIC PANEL
ALT: 22 U/L (ref 0–44)
AST: 20 U/L (ref 15–41)
Albumin: 3.6 g/dL (ref 3.5–5.0)
Alkaline Phosphatase: 64 U/L (ref 38–126)
Anion gap: 7 (ref 5–15)
BUN: 13 mg/dL (ref 6–20)
CO2: 21 mmol/L — ABNORMAL LOW (ref 22–32)
Calcium: 8.5 mg/dL — ABNORMAL LOW (ref 8.9–10.3)
Chloride: 110 mmol/L (ref 98–111)
Creatinine, Ser: 0.71 mg/dL (ref 0.44–1.00)
GFR, Estimated: >60 mL/min (ref >60)
Glucose, Bld: 116 mg/dL — ABNORMAL HIGH (ref 70–99)
Potassium: 3.8 mmol/L (ref 3.5–5.1)
Sodium: 138 mmol/L (ref 135–145)
Total Bilirubin: 0.3 mg/dL (ref 0.3–1.2)
Total Protein: 6.6 g/dL (ref 6.5–8.1)

## 2021-08-23 LAB — CBC WITH DIFFERENTIAL/PLATELET
Abs Immature Granulocytes: 0.01 10*3/uL (ref 0.00–0.07)
Basophils Absolute: 0 10*3/uL (ref 0.0–0.1)
Basophils Relative: 0 %
Eosinophils Absolute: 0.4 10*3/uL (ref 0.0–0.5)
Eosinophils Relative: 5 %
HCT: 43 % (ref 36.0–46.0)
Hemoglobin: 14 g/dL (ref 12.0–15.0)
Immature Granulocytes: 0 %
Lymphocytes Relative: 40 %
Lymphs Abs: 2.9 10*3/uL (ref 0.7–4.0)
MCH: 26.8 pg (ref 26.0–34.0)
MCHC: 32.6 g/dL (ref 30.0–36.0)
MCV: 82.4 fL (ref 80.0–100.0)
Monocytes Absolute: 0.8 10*3/uL (ref 0.1–1.0)
Monocytes Relative: 11 %
Neutro Abs: 3.3 10*3/uL (ref 1.7–7.7)
Neutrophils Relative %: 44 %
Platelets: 432 10*3/uL — ABNORMAL HIGH (ref 150–400)
RBC: 5.22 MIL/uL — ABNORMAL HIGH (ref 3.87–5.11)
RDW: 15 % (ref 11.5–15.5)
WBC: 7.3 10*3/uL (ref 4.0–10.5)
nRBC: 0 % (ref 0.0–0.2)

## 2021-08-23 LAB — TSH: TSH: 5.195 u[IU]/mL — ABNORMAL HIGH (ref 0.350–4.500)

## 2021-08-23 LAB — I-STAT BETA HCG BLOOD, ED (MC, WL, AP ONLY): I-stat hCG, quantitative: <5 m[IU]/mL (ref <5)

## 2021-08-23 LAB — T4, FREE: Free T4: 1.01 ng/dL (ref 0.61–1.12)

## 2021-08-23 MED ORDER — FAMOTIDINE 20 MG PO TABS: 20.0000 mg | ORAL_TABLET | Freq: Two times a day (BID) | ORAL | 0 refills | Status: AC | PRN

## 2021-08-23 MED ORDER — PERMETHRIN 5 % EX CREA: TOPICAL_CREAM | CUTANEOUS | 0 refills | Status: AC

## 2021-08-23 MED ORDER — PREDNISONE 20 MG PO TABS
60.0000 mg | ORAL_TABLET | Freq: Once | ORAL | Status: AC
  Administered 2021-08-23: 60 mg via ORAL
  Filled 2021-08-23: qty 3

## 2021-08-23 MED ORDER — DIPHENHYDRAMINE HCL 25 MG PO TABS: 25.0000 mg | ORAL_TABLET | Freq: Four times a day (QID) | ORAL | 0 refills | Status: AC | PRN

## 2021-08-23 MED ORDER — PREDNISONE 50 MG PO TABS: 50.0000 mg | ORAL_TABLET | Freq: Every day | ORAL | 0 refills | Status: AC

## 2021-08-23 MED ORDER — FAMOTIDINE 20 MG PO TABS
20.0000 mg | ORAL_TABLET | Freq: Once | ORAL | Status: AC
  Administered 2021-08-23: 20 mg via ORAL
  Filled 2021-08-23: qty 1

## 2021-08-23 NOTE — ED Provider Notes (Signed)
[facility] EMERGENCY DEPARTMENT Provider Note   CSN: [id number] Arrival date & time: 08/22/21  1642     History  Chief Complaint  Patient presents with   Rash   Fatigue   Amanda Vaughn is a 26 y.o. female who presents to the emergency department with complaints of rash rash for the past 1 week and problems with intermittent fatigue for the past couple of months.  Patient reports she developed a rash primarily to her trunk, scattered to the upper extremities about 1 week ago.  She states this was after she was at a car wash and to get covered in soap at 1 point.  The rash is pruritic, not painful, initially mildly alleviated by Benadryl however no longer seems to be helping.  She denies any additional new exposures, products, medications, or foods.  Denies any new environments.  Denies tick bites.  She additionally denies trouble breathing, sensation of throat closing, intraoral swelling, or wheezing.  Patient also mentions that she had problems with intermittent fatigue, hot flashes, hunger issues, and nausea for the past 1 to 2 months.  No specific alleviating or aggravating factors.  She is concerned about this and would like to have some blood work done.  She reports she has felt hot at times however has not had any measured fever.  She denies URI symptoms, vomiting, abdominal pain, diarrhea, or syncope.  HPI     Home Medications Prior to Admission medications   Medication Sig Start Date End Date Taking? Authorizing Provider  ondansetron (ZOFRAN-ODT) 4 MG disintegrating tablet Take 1 tablet (4 mg total) by mouth every 8 (eight) hours as needed for nausea or vomiting. 12/29/20   Raspet, Noberto Retort, PA-C  pantoprazole (PROTONIX) 40 MG tablet Take 1 tablet (40 mg total) by mouth daily. 12/29/20   Raspet, Noberto Retort, PA-C      Allergies    Patient has no known allergies.    Review of Systems   Review of Systems  Constitutional:  Positive for fatigue.  Respiratory:   Negative for cough and shortness of breath.   Cardiovascular:  Negative for chest pain.  Gastrointestinal:  Positive for nausea. Negative for abdominal pain and vomiting.  Genitourinary:  Negative for dysuria.  Skin:  Positive for rash.  Neurological:  Negative for syncope.  All other systems reviewed and are negative.   Physical Exam Updated Vital Signs BP (!) 132/92   Pulse 79   Temp 98.9 F (37.2 C) (Oral)   Resp 14   LMP 07/30/2021 (Exact Date)   SpO2 100%  Physical Exam Vitals and nursing note reviewed.  Constitutional:      General: She is not in acute distress.    Appearance: She is well-developed. She is not toxic-appearing.  HENT:     Head: Normocephalic and atraumatic.  Eyes:     General:        Right eye: No discharge.        Left eye: No discharge.     Conjunctiva/sclera: Conjunctivae normal.  Cardiovascular:     Rate and Rhythm: Normal rate and regular rhythm.  Pulmonary:     Effort: No respiratory distress.     Breath sounds: Normal breath sounds. No wheezing or rales.  Abdominal:     General: There is no distension.     Palpations: Abdomen is soft.     Tenderness: There is no abdominal tenderness.  Musculoskeletal:     Cervical back: Neck supple.  Skin:  General: Skin is warm and dry.     Findings: Rash present. No petechiae. Rash is not purpuric.     Comments: Erythematous rash to the trunk pictured below, raised in some areas  She does have a cluster of erythematous papules with some excoriation to the webspace of her right second and third digits in the hand.    No palm/sole lesions.  No mucous membrane lesions.  Neurological:     Mental Status: She is alert.     Comments: Clear speech.   Psychiatric:        Behavior: Behavior normal.          ED Results / Procedures / Treatments   Labs (all labs ordered are listed, but only abnormal results are displayed) Labs Reviewed  CBC WITH DIFFERENTIAL/PLATELET  COMPREHENSIVE METABOLIC  PANEL  TSH  I-STAT BETA HCG BLOOD, ED ([facility], [facility], [facility] ONLY)    EKG None  Radiology No results found.  Procedures Procedures    Medications Ordered in ED Medications - No data to display  ED Course/ Medical Decision Making/ A&P                           Medical Decision Making Amount and/or Complexity of Data Reviewed Labs: ordered.  Risk OTC drugs. Prescription drug management.   Patient presents to the ED with complaints of rash x 1 week and fatigue and several other sxs 1-2 months, this involves an extensive number of treatment options, and is a complaint that carries with it a high risk of complications and morbidity. Nontoxic, vitals fairly unremarkble.   Additional history obtained:  Chart/nursing notes reviewed  Lab Tests:  I viewed & interpreted labs including:  CBC: mild thrombocytosis.  CMP: no critical electrolyte derangement.  Preg test: Negative TSH: Pending.   ED Course:  Fatigue/hot flashes/appetite changes: preg test negative, renal function & LFTs WNL, no critical electrolyte derangement or anemia, TSH pending. Unclear definitive etiology, low suspicion for emergent pathology, PCP follow up.   Rash:  Overall low supsicion for superimposed infection, SJS, TEN, TSS, tick borne illness, syphilis or other life-threatening condition. overall trunk rash appears suspicious for hives vs. Contact/irritant dermatitis vs. Heat rash (however no change w/ temperature). Will trial steroids, pepcid, benadryl. However rash to webspace to right hand is suspicious for scabies- no other lesions for this however will cover for with permethrin.   I discussed results, treatment plan, need for follow-up, and return precautions with the patient. Provided opportunity for questions, patient confirmed understanding and is in agreement with plan.    Portions of this note were generated with Scientist, clinical (histocompatibility and immunogenetics). Dictation errors may occur despite best attempts at  proofreading.   Final Clinical Impression(s) / ED Diagnoses Final diagnoses:  Rash  Fatigue, unspecified type    Rx / DC Orders ED Discharge Orders          Ordered    predniSONE (DELTASONE) 50 MG tablet  Daily with breakfast        08/23/21 0241    famotidine (PEPCID) 20 MG tablet  2 times daily PRN        08/23/21 0241    diphenhydrAMINE (BENADRYL) 25 MG tablet  Every 6 hours PRN        08/23/21 0241    permethrin (ELIMITE) 5 % cream        08/23/21 0241              Starr Urias,  Lucius Conn 08/23/21 [id number]    Dione Booze, MD 08/23/21 [id number]

## 2021-08-23 NOTE — Discharge Instructions (Addendum)
You were seen in the ER today for a rash.  We suspect that the rash to your trunk area could be hives but also could be dermatitis from an irritant.  We are sending you with prednisone to start taking 07/29 as we gave your first dose in the ED.  We are also sending you home with Benadryl and Pepcid to take as needed for the rash and itching.  Please follow-up with the allergist to check to make sure you are not allergic to anything specifically.  We have prescribed you new medication(s) today. Discuss the medications prescribed today with your pharmacist as they can have adverse effects and interactions with your other medicines including over the counter and prescribed medications. Seek medical evaluation if you start to experience new or abnormal symptoms after taking one of these medicines, seek care immediately if you start to experience difficulty breathing, feeling of your throat closing, facial swelling, or rash as these could be indications of a more serious allergic reaction  Given you do have the 1 area between your fingers we are also sending home with permethrin to cover for possible scabies.  Your labs were overall reassuring, however your TSH was low she your thyroid was very mildly elevated, we have sent additional testing, please follow-up with your primary care provider soon as possible to discuss this as well as to recheck your symptoms..   Return to the ER for any new or worsening symptoms or any other concerns.
